# Patient Record
Sex: Female | Born: 1995 | Race: White | Hispanic: No | Marital: Single | Smoking: Never smoker
Health system: Southern US, Community
[De-identification: ages and names within clinical notes are randomized; demographics above are authoritative.]

---

## 2017-03-05 ENCOUNTER — Emergency Department
Admission: EM | Admit: 2017-03-05 | Discharge: 2017-03-05 | Disposition: A | Discharge status: Home / Self Care | Payer: 59 | Attending: Emergency Medicine | Admitting: Emergency Medicine

## 2017-03-05 DIAGNOSIS — F10120 Alcohol abuse with intoxication, uncomplicated: Secondary | ICD-10-CM | POA: Diagnosis not present

## 2017-03-05 DIAGNOSIS — Z5181 Encounter for therapeutic drug level monitoring: Secondary | ICD-10-CM | POA: Insufficient documentation

## 2017-03-05 DIAGNOSIS — R112 Nausea with vomiting, unspecified: Secondary | ICD-10-CM | POA: Diagnosis present

## 2017-03-05 DIAGNOSIS — F10920 Alcohol use, unspecified with intoxication, uncomplicated: Secondary | ICD-10-CM

## 2017-03-05 LAB — COMPREHENSIVE METABOLIC PANEL
ALBUMIN: 4.2 g/dL (ref 3.5–5.0)
ALT: 24 U/L (ref 14–54)
AST: 36 U/L (ref 15–41)
Alkaline Phosphatase: 43 U/L (ref 38–126)
Anion gap: 6 (ref 5–15)
BILIRUBIN TOTAL: 0.4 mg/dL (ref 0.3–1.2)
BUN: 12 mg/dL (ref 6–20)
CO2: 26 mmol/L (ref 22–32)
CREATININE: 0.72 mg/dL (ref 0.44–1.00)
Calcium: 8.6 mg/dL — ABNORMAL LOW (ref 8.9–10.3)
Chloride: 109 mmol/L (ref 101–111)
GFR calc Af Amer: >60 mL/min (ref >60)
GLUCOSE: 97 mg/dL (ref 65–99)
Potassium: 3.3 mmol/L — ABNORMAL LOW (ref 3.5–5.1)
Sodium: 141 mmol/L (ref 135–145)
TOTAL PROTEIN: 7.6 g/dL (ref 6.5–8.1)

## 2017-03-05 LAB — URINE DRUG SCREEN, QUALITATIVE (ARMC ONLY)
AMPHETAMINES, UR SCREEN: NOT DETECTED
BENZODIAZEPINE, UR SCRN: NOT DETECTED
Barbiturates, Ur Screen: NOT DETECTED
Cannabinoid 50 Ng, Ur ~~LOC~~: NOT DETECTED
Cocaine Metabolite,Ur ~~LOC~~: NOT DETECTED
MDMA (Ecstasy)Ur Screen: NOT DETECTED
METHADONE SCREEN, URINE: NOT DETECTED
OPIATE, UR SCREEN: NOT DETECTED
Phencyclidine (PCP) Ur S: NOT DETECTED
Tricyclic, Ur Screen: NOT DETECTED

## 2017-03-05 LAB — CBC WITH DIFFERENTIAL/PLATELET
BASOS ABS: 0 10*3/uL (ref 0–0.1)
Basophils Relative: 1 %
Eosinophils Absolute: 0.2 10*3/uL (ref 0–0.7)
Eosinophils Relative: 3 %
HEMATOCRIT: 33.8 % — AB (ref 35.0–47.0)
HEMOGLOBIN: 11.4 g/dL — AB (ref 12.0–16.0)
LYMPHS PCT: 38 %
Lymphs Abs: 1.9 10*3/uL (ref 1.0–3.6)
MCH: 29.5 pg (ref 26.0–34.0)
MCHC: 33.6 g/dL (ref 32.0–36.0)
MCV: 87.9 fL (ref 80.0–100.0)
Monocytes Absolute: 0.4 10*3/uL (ref 0.2–0.9)
Monocytes Relative: 8 %
NEUTROS PCT: 50 %
Neutro Abs: 2.5 10*3/uL (ref 1.4–6.5)
Platelets: 132 10*3/uL — ABNORMAL LOW (ref 150–440)
RBC: 3.85 MIL/uL (ref 3.80–5.20)
RDW: 13.8 % (ref 11.5–14.5)
WBC: 5.1 10*3/uL (ref 3.6–11.0)

## 2017-03-05 LAB — PREGNANCY, URINE: Preg Test, Ur: NEGATIVE

## 2017-03-05 LAB — ETHANOL: ALCOHOL ETHYL (B): 260 mg/dL — AB (ref <5)

## 2017-03-05 MED ORDER — SODIUM CHLORIDE 0.9 % IV BOLUS (SEPSIS)
1000.0000 mL | Freq: Once | INTRAVENOUS | Status: AC
  Administered 2017-03-05: 1000 mL via INTRAVENOUS

## 2017-03-05 NOTE — ED Triage Notes (Signed)
[facility] Per EMS witnesses and friends state they was at a party drinking and she got sick. Unknown how much she truly consumed. Patient stated "a lot". Per EMS patient will respond to sternum rub. Patient will awaken when moved, sternal rub. Patient currently sleeping.

## 2017-03-05 NOTE — Discharge Instructions (Signed)
Please drink plenty of fluids and take Tylenol for headache and body aches.  Please return immediately if condition worsens. Please contact her primary physician or the physician you were given for referral. If you have any specialist physicians involved in her treatment and plan please also contact them. Thank you for using  regional emergency Department.

## 2017-03-05 NOTE — ED Provider Notes (Signed)
Time Seen: Approximately0233  I have reviewed the triage notes  Chief Complaint: Alcohol Intoxication   History of Present Illness: Christine Villegas is a 21 y.o. female  who apparently was at a party at [facility] when witnesses state that the patient had been drinking and then got very nauseated vomited multiple times. When asked how much the patient drank the patient stated "" a lot "". She responds to sternal rub otherwise is. Drowsy. No other evidence of any illicit drugs ,, head trauma, etc.   No past medical history on file.  There are no active problems to display for this patient.   No past surgical history on file.  No past surgical history on file.    Allergies:  Patient has no allergy information on record.  Family History: No family history on file.  Social History: Social History  Substance Use Topics  . Smoking status: Not on file  . Smokeless tobacco: Not on file  . Alcohol use Not on file     Review of Systems:   10 point review of systems was performed and was otherwise negative: Patient's currently inebriated. No obvious homicidal or suicidal thoughts. Constitutional: No fever Eyes: No visual disturbances ENT: No sore throat, ear pain Cardiac: No chest pain Respiratory: No shortness of breath, wheezing, or stridor Abdomen: No abdominal pain, no vomiting, No diarrhea Endocrine: No weight loss, No night sweats Extremities: No peripheral edema, cyanosis Skin: No rashes, easy bruising Neurologic: No focal weakness, trouble with speech or swollowing Urologic: No dysuria, Hematuria, or urinary frequency   Physical Exam:  ED Triage Vitals [03/05/17 0228]  Enc Vitals Group     BP (!) 93/51     Pulse Rate (!) 56     Resp 15     Temp (!) 96.4 F (35.8 C)     Temp Source Rectal     SpO2 99 %     Weight 125 lb (56.7 kg)     Height      Head Circumference      Peak Flow      Pain Score      Pain Loc      Pain Edu?      Excl. in GC?      General: Currently inebriated. Smells of alcohol Head: Normal cephalic , atraumatic Eyes: Pupils equal , round, reactive to light Nose/Throat: No nasal drainage, patent upper airway without erythema or exudate.  Neck: Supple, Full range of motion, No anterior adenopathy or palpable thyroid masses Lungs: Clear to ascultation without wheezes , rhonchi, or rales Heart: Regular rate, regular rhythm without murmurs , gallops , or rubs Abdomen: Soft, non tender without rebound, guarding , or rigidity; bowel sounds positive and symmetric in all 4 quadrants. No organomegaly .        Extremities: 2 plus symmetric pulses. No edema, clubbing or cyanosis Neurologic: normal ambulation, Motor symmetric without deficits, sensory intact Skin: warm, dry, no rashes   Labs:   All laboratory work was reviewed including any pertinent negatives or positives listed below:  Labs Reviewed  CBC WITH DIFFERENTIAL/PLATELET - Abnormal; Notable for the following:       Result Value   Hemoglobin 11.4 (*)    HCT 33.8 (*)    Platelets 132 (*)    All other components within normal limits  COMPREHENSIVE METABOLIC PANEL - Abnormal; Notable for the following:    Potassium 3.3 (*)    Calcium 8.6 (*)    All other components  within normal limits  ETHANOL - Abnormal; Notable for the following:    Alcohol, Ethyl (B) 260 (*)    All other components within normal limits  URINE DRUG SCREEN, QUALITATIVE ([facility] ONLY)  PREGNANCY, URINE  POC URINE PREG, ED      ED Course:  Patient will be observed here in emergency department until she reaches sobriety. No obvious evidence of any other illicit drugs, etc. The patient does not have any evidence of head trauma  Assessment: * Alcohol intoxication      Plan:  Outpatient The patient will be observed here in emergency department until she reaches the point where she is able to converse, ambulate unassisted, heat and drink. We also need to confirm that she had no  intentions of harming herself with her alcohol ingestion.Jennye Moccasin.            Adrienne Trombetta S Mikahla Wisor, MD 03/05/17 [phone]

## 2017-03-05 NOTE — ED Notes (Signed)
Patient laying in bed with warm blankets. Pulse ox applied to finger. Will continue to monitor patient.

## 2017-03-05 NOTE — ED Notes (Signed)
Patient's mom called regarding patient. Told mom that patient is here, safe and stable. Patient unable to speak with mom after trying to awaken her. Mom will call back in the morning for update.

## 2017-03-05 NOTE — ED Notes (Signed)
Patient d/ced to lobby with paper from [place] representative that has the [employer] number on it. Patient is calling cab to take her back to [place]. Explained to patient she needs to call her mom that she called asking about her. Patient very tearful about situation.

## 2017-03-30 ENCOUNTER — Emergency Department
Admission: EM | Admit: 2017-03-30 | Discharge: 2017-03-30 | Disposition: A | Discharge status: Home / Self Care | Payer: 59 | Attending: Emergency Medicine | Admitting: Emergency Medicine

## 2017-03-30 DIAGNOSIS — K2970 Gastritis, unspecified, without bleeding: Secondary | ICD-10-CM | POA: Insufficient documentation

## 2017-03-30 DIAGNOSIS — R109 Unspecified abdominal pain: Secondary | ICD-10-CM

## 2017-03-30 DIAGNOSIS — R1010 Upper abdominal pain, unspecified: Secondary | ICD-10-CM | POA: Diagnosis present

## 2017-03-30 LAB — CBC
HEMATOCRIT: 38.5 % (ref 35.0–47.0)
Hemoglobin: 12.9 g/dL (ref 12.0–16.0)
MCH: 29.3 pg (ref 26.0–34.0)
MCHC: 33.5 g/dL (ref 32.0–36.0)
MCV: 87.4 fL (ref 80.0–100.0)
Platelets: 185 10*3/uL (ref 150–440)
RBC: 4.4 MIL/uL (ref 3.80–5.20)
RDW: 13.2 % (ref 11.5–14.5)
WBC: 7.3 10*3/uL (ref 3.6–11.0)

## 2017-03-30 LAB — COMPREHENSIVE METABOLIC PANEL
ALBUMIN: 4.4 g/dL (ref 3.5–5.0)
ALK PHOS: 56 U/L (ref 38–126)
ALT: 18 U/L (ref 14–54)
ANION GAP: 8 (ref 5–15)
AST: 35 U/L (ref 15–41)
BUN: 12 mg/dL (ref 6–20)
CHLORIDE: 103 mmol/L (ref 101–111)
CO2: 29 mmol/L (ref 22–32)
Calcium: 9.6 mg/dL (ref 8.9–10.3)
Creatinine, Ser: 0.7 mg/dL (ref 0.44–1.00)
GFR calc Af Amer: >60 mL/min (ref >60)
Glucose, Bld: 99 mg/dL (ref 65–99)
POTASSIUM: 3.8 mmol/L (ref 3.5–5.1)
SODIUM: 140 mmol/L (ref 135–145)
TOTAL PROTEIN: 8.5 g/dL — AB (ref 6.5–8.1)
Total Bilirubin: 0.8 mg/dL (ref 0.3–1.2)

## 2017-03-30 LAB — URINALYSIS, COMPLETE (UACMP) WITH MICROSCOPIC
BACTERIA UA: NONE SEEN
Bilirubin Urine: NEGATIVE
GLUCOSE, UA: NEGATIVE mg/dL
Hgb urine dipstick: NEGATIVE
KETONES UR: NEGATIVE mg/dL
NITRITE: NEGATIVE
PROTEIN: 30 mg/dL — AB
Specific Gravity, Urine: 1.03 (ref 1.005–1.030)
pH: 6 (ref 5.0–8.0)

## 2017-03-30 LAB — POCT PREGNANCY, URINE: Preg Test, Ur: NEGATIVE

## 2017-03-30 LAB — LIPASE, BLOOD: LIPASE: 25 U/L (ref 11–51)

## 2017-03-30 MED ORDER — FAMOTIDINE 40 MG PO TABS: 40.0000 mg | ORAL_TABLET | Freq: Every evening | ORAL | 1 refills | Status: AC

## 2017-03-30 MED ORDER — GI COCKTAIL ~~LOC~~
30.0000 mL | Freq: Once | ORAL | Status: AC
  Administered 2017-03-30: 30 mL via ORAL
  Filled 2017-03-30: qty 30

## 2017-03-30 MED ORDER — SUCRALFATE 1 G PO TABS: 1.0000 g | ORAL_TABLET | Freq: Four times a day (QID) | ORAL | 0 refills | Status: AC

## 2017-03-30 NOTE — Discharge Instructions (Signed)
Please seek medical attention for any high fevers, chest pain, shortness of breath, change in behavior, persistent vomiting, bloody stool or any other new or concerning symptoms.  

## 2017-03-30 NOTE — ED Notes (Signed)
FIRST NURSE NOTE: Abdominal pain that started last night and worse this morning.

## 2017-03-30 NOTE — ED Triage Notes (Signed)
Pt c/o upper abd pain that woke from sleep this am. Reports similar pain yesterday that also resolved. Pain eased now per pt report. Reports ETOH yesterday,

## 2017-03-30 NOTE — ED Provider Notes (Signed)
[facility] Emergency Department Provider Note  ____________________________________________   I have reviewed the triage vital signs and the nursing notes.   HISTORY  Chief Complaint Abdominal Pain   History limited by: Not Limited   HPI Christine Villegas is a 21 y.o. female who presents to the emergency department today because of concerns for abdominal pain. Is located in the upper abdomen. It started this morning. It was severe however has since somewhat dissipated. She denies any associated nausea or vomiting. No recent change in defecation. Shortness of breath. Patient states she had similar pain yesterday morning. She does admit to drinking alcohol the previous to nights. Additionally the patient is on antibiotics for strep throat and states she has been taking Advil for strep throat. Denies any history of heart burn or acid reflex.     History reviewed. No pertinent past medical history.  There are no active problems to display for this patient.   History reviewed. No pertinent surgical history.  Prior to Admission medications   Not on File    Allergies Patient has no known allergies.  History reviewed. No pertinent family history.  Social History Social History  Substance Use Topics  . Smoking status: Never Smoker  . Smokeless tobacco: Never Used  . Alcohol use Yes    Review of Systems  Constitutional: Negative for fever. Cardiovascular: Negative for chest pain. Respiratory: Negative for shortness of breath. Gastrointestinal: Positive for upper abdominal pain. Genitourinary: Negative for dysuria. Musculoskeletal: Negative for back pain. Skin: Negative for rash. Neurological: Negative for headaches, focal weakness or numbness.  10-point ROS otherwise negative.  ____________________________________________   PHYSICAL EXAM:  VITAL SIGNS: ED Triage Vitals  Enc Vitals Group     BP 03/30/17 0832 130/84     Pulse Rate 03/30/17  0832 72     Resp 03/30/17 0832 18     Temp 03/30/17 0832 97.8 F (36.6 C)     Temp Source 03/30/17 0832 Oral     SpO2 03/30/17 0832 99 %     Weight 03/30/17 0833 130 lb (59 kg)     Height 03/30/17 0833  (1.651 m)     Head Circumference --      Peak Flow --      Pain Score 03/30/17 0836 2   Constitutional: Alert and oriented. Well appearing and in no distress. Eyes: Conjunctivae are normal. Normal extraocular movements. ENT   Head: Normocephalic and atraumatic.   Nose: No congestion/rhinnorhea.   Mouth/Throat: Mucous membranes are moist.   Neck: No stridor. Hematological/Lymphatic/Immunilogical: No cervical lymphadenopathy. Cardiovascular: Normal rate, regular rhythm.  No murmurs, rubs, or gallops.  Respiratory: Normal respiratory effort without tachypnea nor retractions. Breath sounds are clear and equal bilaterally. No wheezes/rales/rhonchi. Gastrointestinal: Soft and non tender. No rebound. No guarding.  Genitourinary: Deferred Musculoskeletal: Normal range of motion in all extremities. No lower extremity edema. Neurologic:  Normal speech and language. No gross focal neurologic deficits are appreciated.  Skin:  Skin is warm, dry and intact. No rash noted. Psychiatric: Mood and affect are normal. Speech and behavior are normal. Patient exhibits appropriate insight and judgment.  ____________________________________________    LABS (pertinent positives/negatives)  Labs Reviewed  COMPREHENSIVE METABOLIC PANEL - Abnormal; Notable for the following:       Result Value   Total Protein 8.5 (*)    All other components within normal limits  URINALYSIS, COMPLETE (UACMP) WITH MICROSCOPIC - Abnormal; Notable for the following:    Color, Urine YELLOW (*)  APPearance CLOUDY (*)    Protein, ur 30 (*)    Leukocytes, UA MODERATE (*)    Squamous Epithelial / LPF 6-30 (*)    All other components within normal limits  URINE CULTURE  CBC  LIPASE, BLOOD  POCT  PREGNANCY, URINE     ____________________________________________   EKG  None  ____________________________________________    RADIOLOGY  None  ____________________________________________   PROCEDURES  Procedures  ____________________________________________   INITIAL IMPRESSION / ASSESSMENT AND PLAN / ED COURSE  Pertinent labs & imaging results that were available during my care of the patient were reviewed by me and considered in my medical decision making (see chart for details).  Patient presented to the emergency department today because of concerns for epigastric pain. This point I think gastritis likely. Patient's blood work without any concerning findings. She did get some improvement with GI cocktail. She does admit to drinking alcohol as well as using Advil over the past week. Will discharge with antiacid and sucralfate.  ____________________________________________   FINAL CLINICAL IMPRESSION(S) / ED DIAGNOSES  Final diagnoses:  Abdominal pain, unspecified abdominal location  Gastritis without bleeding, unspecified chronicity, unspecified gastritis type     Note: This dictation was prepared with Dragon dictation. Any transcriptional errors that result from this process are unintentional     Phineas Semen, MD 03/30/17 1058

## 2017-04-01 LAB — URINE CULTURE

## 2017-10-06 ENCOUNTER — Encounter: Payer: Self-pay | Admitting: *Deleted

## 2017-10-06 ENCOUNTER — Emergency Department
Admission: EM | Admit: 2017-10-06 | Discharge: 2017-10-06 | Disposition: A | Discharge status: Home / Self Care | Payer: 59 | Attending: Student in an Organized Health Care Education/Training Program | Admitting: Student in an Organized Health Care Education/Training Program

## 2017-10-06 ENCOUNTER — Emergency Department: Payer: 59

## 2017-10-06 DIAGNOSIS — S0083XA Contusion of other part of head, initial encounter: Secondary | ICD-10-CM

## 2017-10-06 DIAGNOSIS — Y929 Unspecified place or not applicable: Secondary | ICD-10-CM | POA: Insufficient documentation

## 2017-10-06 DIAGNOSIS — S0990XA Unspecified injury of head, initial encounter: Secondary | ICD-10-CM

## 2017-10-06 DIAGNOSIS — Z79899 Other long term (current) drug therapy: Secondary | ICD-10-CM | POA: Diagnosis not present

## 2017-10-06 DIAGNOSIS — W1789XA Other fall from one level to another, initial encounter: Secondary | ICD-10-CM | POA: Diagnosis not present

## 2017-10-06 DIAGNOSIS — Y999 Unspecified external cause status: Secondary | ICD-10-CM | POA: Insufficient documentation

## 2017-10-06 DIAGNOSIS — Y939 Activity, unspecified: Secondary | ICD-10-CM | POA: Diagnosis not present

## 2017-10-06 NOTE — ED Notes (Signed)
Pt ambulatory to and from bathroom by self with steady gait noted.

## 2017-10-06 NOTE — ED Provider Notes (Signed)
[facility] Emergency Department Provider Note   ____________________________________________   None    (approximate)  I have reviewed the triage vital signs and the nursing notes.   HISTORY  Chief Complaint Fall    HPI Christine Villegas is a 21 y.o. female patient complaining of continued headache and one episode ofbleeding secondary to a fall. Patient states she was on an overpass approximately 8-10 feet there which collapsed. Patient states she slated striking her head and landed on her left side. Patient state incident occurred 2 days ago. Patient was seen at urgent care clinic and was told she had 2 nondisplaced fracture on the left side. Patient's current concern because of the continuing headache in the nosebleed. Patient admits to alcohol use prior to incident. Patient denies loss of consciousness. Patient rates the pain as a 6/10. Patient described a pain as acute headache. Patient state has taken ibuprofen but did not fill the narcotic pain medication prescribed by urgent care clinic.   History reviewed. No pertinent past medical history.  There are no active problems to display for this patient.   History reviewed. No pertinent surgical history.  Prior to Admission medications   Medication Sig Start Date End Date Taking? Authorizing Provider  famotidine (PEPCID) 40 MG tablet Take 1 tablet (40 mg total) by mouth every evening. 03/30/17 03/30/18  Phineas Semen, MD  JUNEL FE 1.5/30 1.5-30 MG-MCG tablet Take 1 tablet by mouth daily. 03/12/17   [provider]  sucralfate (CARAFATE) 1 g tablet Take 1 tablet (1 g total) by mouth 4 (four) times daily. 03/30/17   Phineas Semen, MD    Allergies Patient has no known allergies.  History reviewed. No pertinent family history.  Social History Social History  Substance Use Topics  . Smoking status: Never Smoker  . Smokeless tobacco: Never Used  . Alcohol use Yes    Review of  Systems Constitutional: No fever/chills Eyes: No visual changes. ENT: No sore throat. Cardiovascular: Denies chest pain. Respiratory: Denies shortness of breath. Gastrointestinal: No abdominal pain.  No nausea, no vomiting.  No diarrhea.  No constipation. Genitourinary: Negative for dysuria. Musculoskeletal: Negative for back pain. Skin: Negative for rash. Neurological: Negative for headaches, focal weakness or numbness.   ____________________________________________   PHYSICAL EXAM:  VITAL SIGNS: ED Triage Vitals  Enc Vitals Group     BP 10/06/17 1436 134/83     Pulse Rate 10/06/17 1436 84     Resp 10/06/17 1436 18     Temp 10/06/17 1436 98.6 F (37 C)     Temp Source 10/06/17 1436 Oral     SpO2 10/06/17 1436 100 %     Weight 10/06/17 1437 135 lb (61.2 kg)     Height 10/06/17 1437 5\' 5"  (1.651 m)     Head Circumference --      Peak Flow --      Pain Score 10/06/17 1436 7     Pain Loc --      Pain Edu? --      Excl. in GC? --    Constitutional: Alert and oriented. Well appearing and in no acute distress. Eyes: Conjunctivae are normal. PERRL. EOMI. Head: Atraumatic. Nose: No congestion/rhinnorhea. Mouth/Throat: Mucous membranes are moist.  Oropharynx non-erythematous. Neck: No stridor.  No cervical spine tenderness to palpation. Hematological/Lymphatic/Immunilogical: No cervical lymphadenopathy. Cardiovascular: Normal rate, regular rhythm. Grossly normal heart sounds.  Good peripheral circulation. Respiratory: Normal respiratory effort.  No retractions. Lungs CTAB. Gastrointestinal: Soft and nontender. No  distention. No abdominal bruits. No CVA tenderness. Musculoskeletal: No lower extremity tenderness nor edema.  No joint effusions. Neurologic:  Normal speech and language. No gross focal neurologic deficits are appreciated. No gait instability. Skin:  Skin is warm, dry and intact. No rash noted. Psychiatric: Mood and affect are normal. Speech and behavior are  normal.  ____________________________________________   LABS (all labs ordered are listed, but only abnormal results are displayed)  Labs Reviewed - No data to display ____________________________________________  EKG   ____________________________________________  RADIOLOGY  Ct Head Wo Contrast  Result Date: 10/06/2017 CLINICAL DATA:  Fall 2 days ago.  Headache EXAM: CT HEAD WITHOUT CONTRAST TECHNIQUE: Contiguous axial images were obtained from the base of the skull through the vertex without intravenous contrast. COMPARISON:  None. FINDINGS: Brain: No evidence of acute infarction, hemorrhage, hydrocephalus, extra-axial collection or mass lesion/mass effect. Vascular: No hyperdense vessel or unexpected calcification. Skull: Negative Sinuses/Orbits: Negative Other: None IMPRESSION: Negative CT head Electronically Signed   By: Marlan Palauharles  Clark M.D.   On: 10/06/2017 16:21    No acute findings on head CT. ___________________________________________   PROCEDURES  Procedure(s) performed: None  Procedures  Critical Care performed: No  ____________________________________________   INITIAL IMPRESSION / ASSESSMENT AND PLAN / ED COURSE  As part of my medical decision making, I reviewed the following data within the electronic MEDICAL RECORD NUMBER    Minor head injury and facial contusion secondary to a fall. Nondisplaced left sixth and seventh vertebrae Discussed negative CT findings with patient. Patient given discharge care instructions. Patient advised to continue extra strength Tylenol for the next 24 hours. Patient may*Percocetsafter 24 hours for rib fracture pain. Patient advised to follow-up with the clinic in 2 weeks unless condition worsens.    ____________________________________________   FINAL CLINICAL IMPRESSION(S) / ED DIAGNOSES  Final diagnoses:  Minor head injury, initial encounter  Contusion of face, initial encounter      NEW MEDICATIONS STARTED DURING  THIS VISIT:  New Prescriptions   No medications on file     Note:  This document was prepared using Dragon voice recognition software and may include unintentional dictation errors.    Joni ReiningSmith, Ronald K, PA-C 10/06/17 1635    Governor RooksLord, Rebecca, MD 10/08/17 [phone]

## 2017-10-06 NOTE — ED Triage Notes (Signed)
States Saturday she was on a "homemade overhang" that collapsed on Friday states she slipped down with the roof, states she was seen at urgent care [facility] and told she had 2 broken ribs, states continued headache, briusing to left forehead noted, states she had a bloody nose yesterday and wants to be re-checked, awake and alert in no acute distress

## 2017-10-06 NOTE — Discharge Instructions (Signed)
Advised 650 mg of Tylenol for the next 24 hours. After 24 hours may start taking Percocets as needed for rib pain. No strenuous activities for 4-6 weeks.

## 2019-03-16 IMAGING — CT CT HEAD W/O CM
3 series · 15 of 45 positions shown, 18 images · non-contrast
Comparison: None.

CLINICAL DATA: Fall 2 days ago.  Headache

EXAM:
CT HEAD WITHOUT CONTRAST
TECHNIQUE: Contiguous axial images were obtained from the base of the skull
through the vertex without intravenous contrast.

[Series 2: head wo · axial · 0.42mm/px · z∈[-125,-10]mm · 9 of 28 slices shown, 12 images]
[im 3/28  brain]
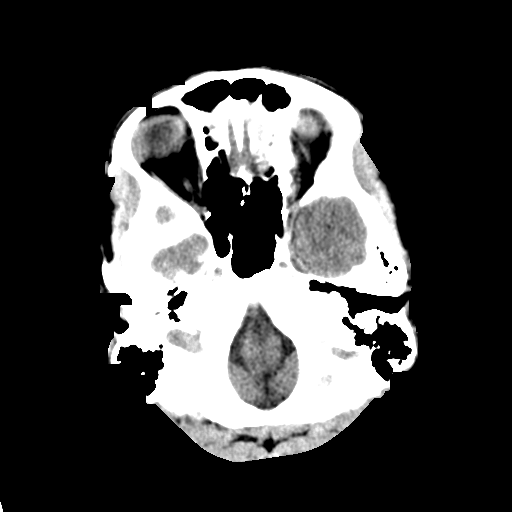
[im 3/28  bone]
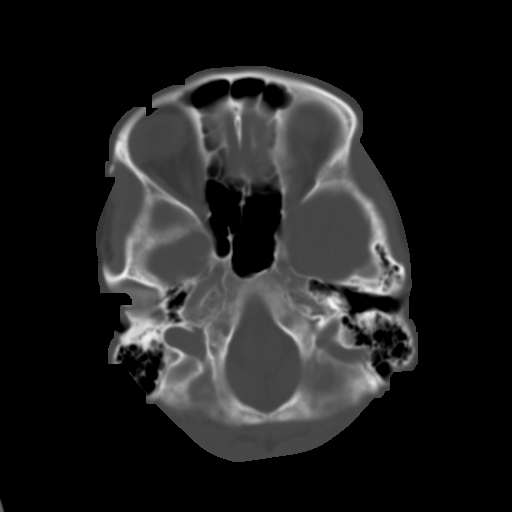
[im 6/28  brain]
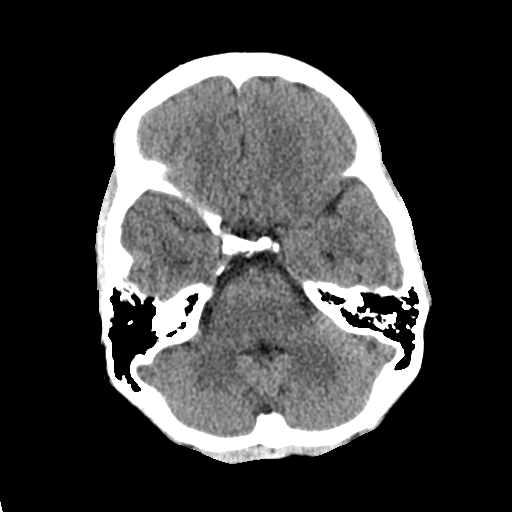
[im 9/28  brain]
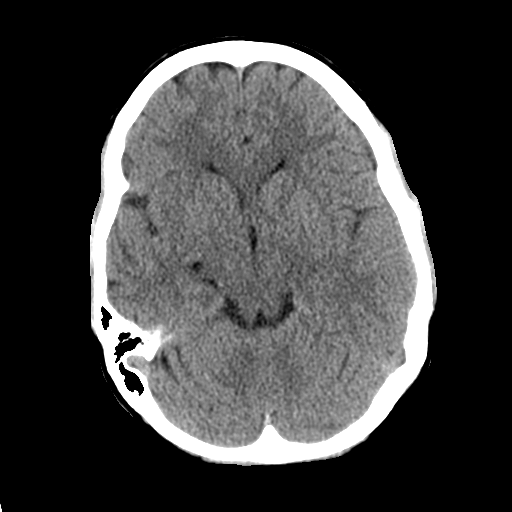
[im 12/28  brain]
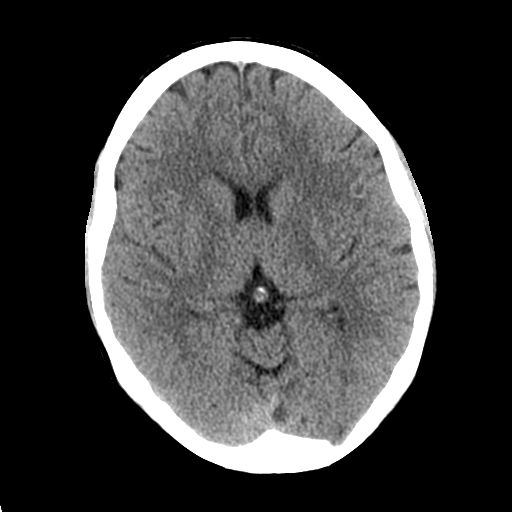
[im 15/28  brain]
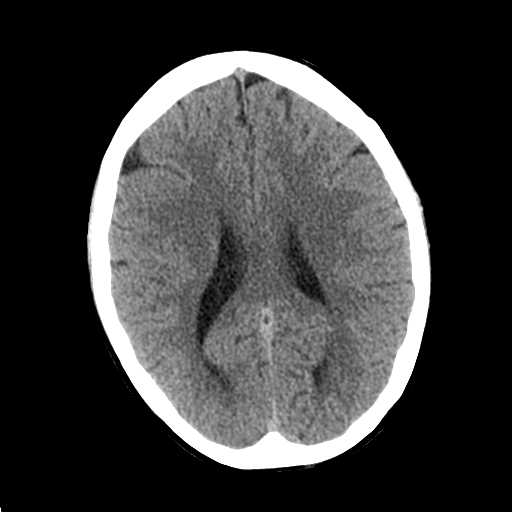
[im 15/28  bone]
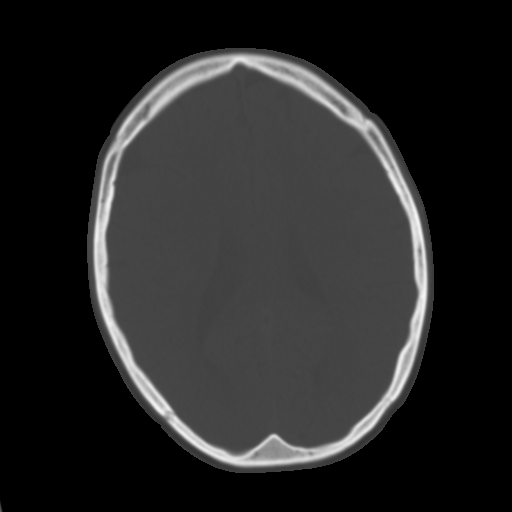
[im 17/28  brain]
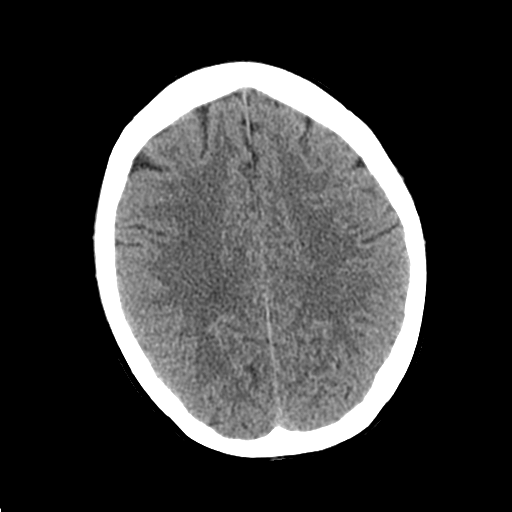
[im 20/28  brain]
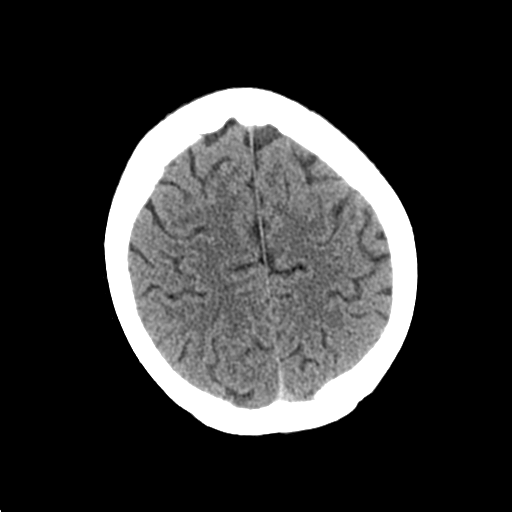
[im 23/28  brain]
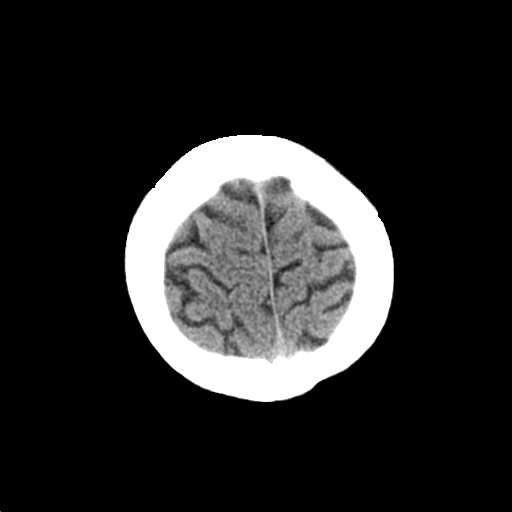
[im 26/28  brain]
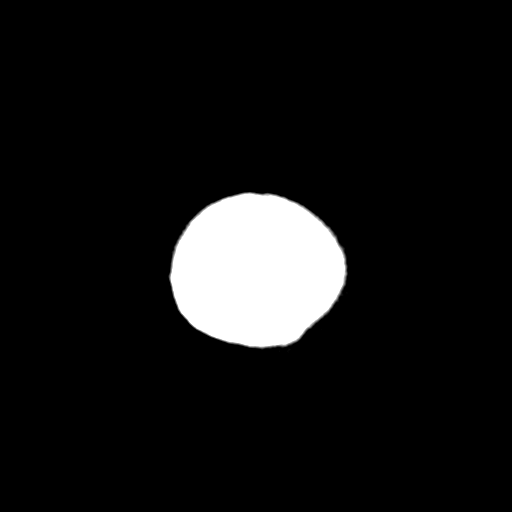
[im 26/28  bone]
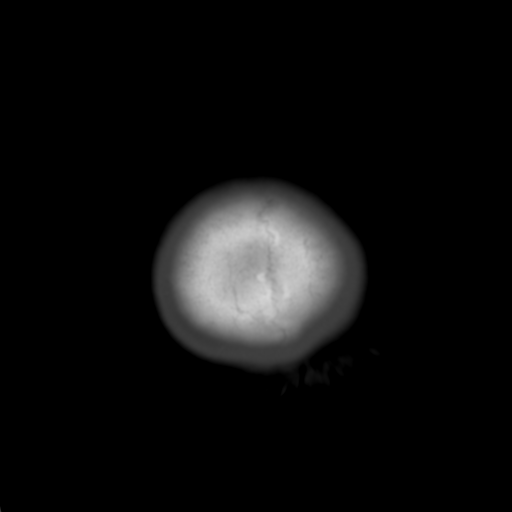

[Series 4: coronal soft tissue · coronal · 0.29mm/px · 3 of 67 slices shown]
[im 23/67  brain]
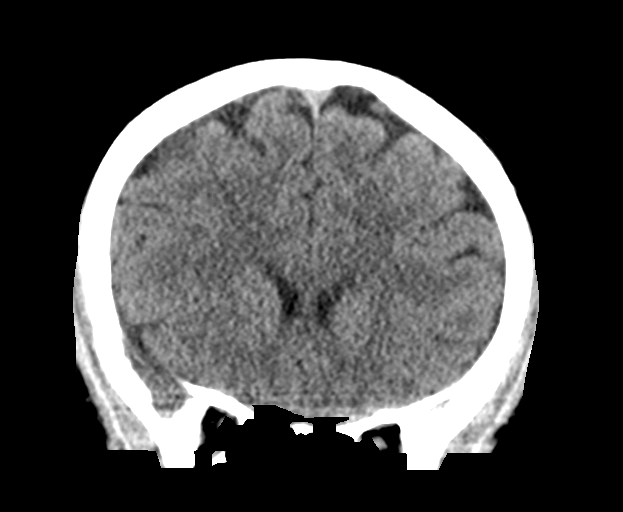
[im 30/67  brain]
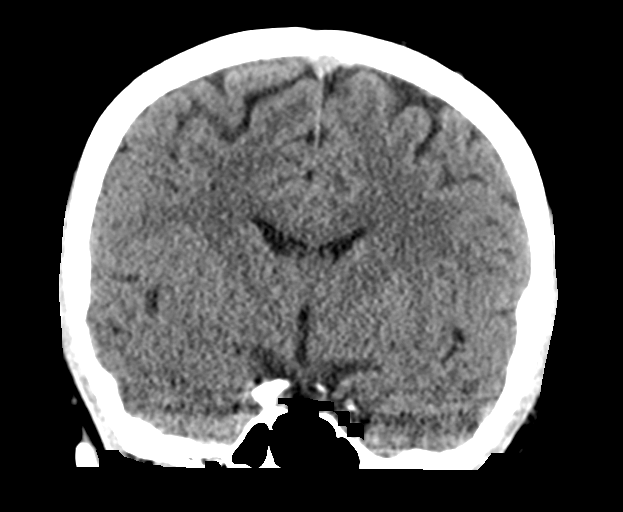
[im 37/67  brain]
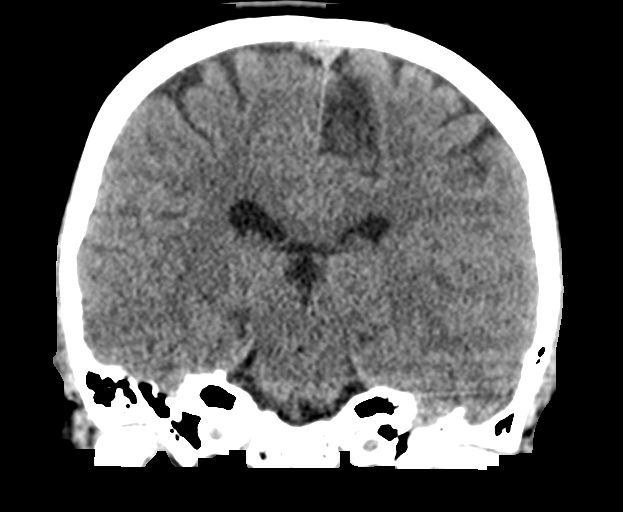

[Series 5: sagittal soft tissue · sagittal · 0.30mm/px · 3 of 53 slices shown]
[im 18/53  brain]
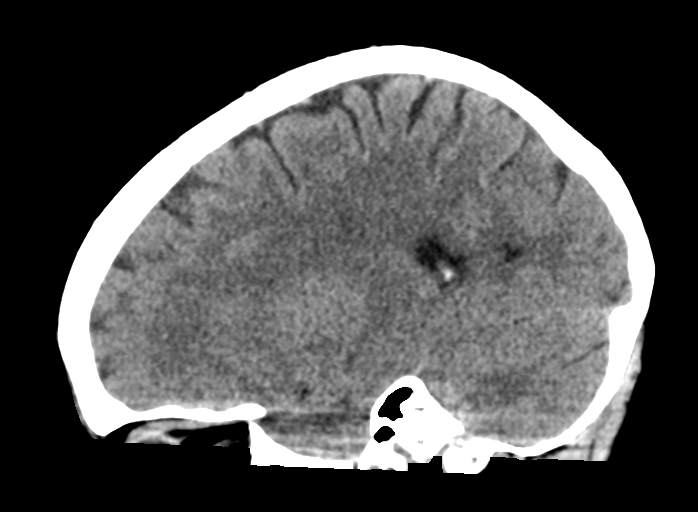
[im 27/53  brain]
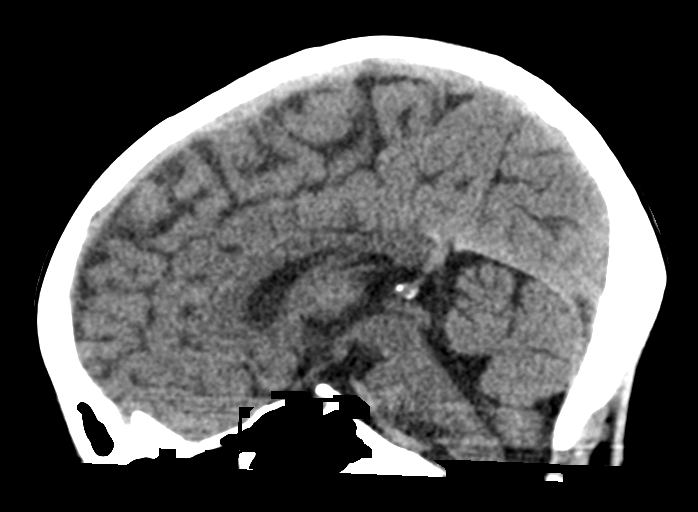
[im 35/53  brain]
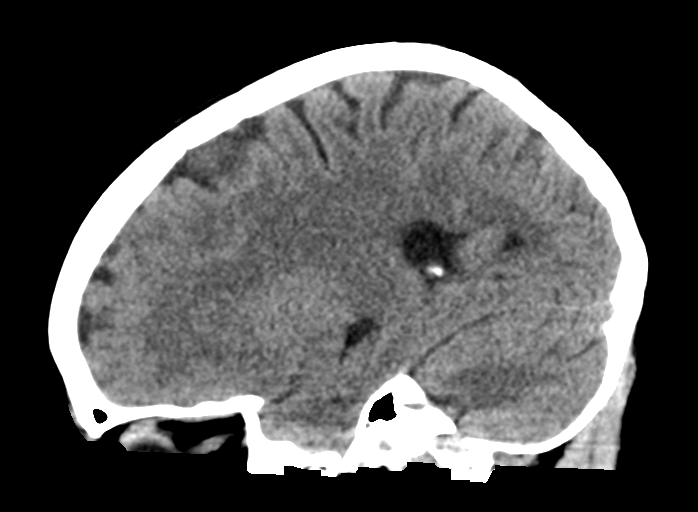

[15 of 45 positions shown; findings below may reference images not displayed]

FINDINGS: Brain: No evidence of acute infarction, hemorrhage, hydrocephalus,
extra-axial collection or mass lesion/mass effect.

Vascular: No hyperdense vessel or unexpected calcification.

Skull: Negative

Sinuses/Orbits: Negative

Other: None
IMPRESSION: Negative CT head

## 2019-09-06 ENCOUNTER — Other Ambulatory Visit: Payer: Self-pay

## 2019-09-06 DIAGNOSIS — Z20822 Contact with and (suspected) exposure to covid-19: Secondary | ICD-10-CM

## 2019-09-07 LAB — NOVEL CORONAVIRUS, NAA: SARS-CoV-2, NAA: NOT DETECTED

## 2019-09-27 ENCOUNTER — Other Ambulatory Visit: Payer: Self-pay

## 2019-09-27 DIAGNOSIS — Z20822 Contact with and (suspected) exposure to covid-19: Secondary | ICD-10-CM

## 2019-09-29 LAB — NOVEL CORONAVIRUS, NAA: SARS-CoV-2, NAA: NOT DETECTED

## 2019-10-26 ENCOUNTER — Other Ambulatory Visit: Payer: Self-pay

## 2019-10-26 DIAGNOSIS — Z20822 Contact with and (suspected) exposure to covid-19: Secondary | ICD-10-CM

## 2019-10-28 LAB — NOVEL CORONAVIRUS, NAA: SARS-CoV-2, NAA: NOT DETECTED
